# Patient Record
Sex: Male | Born: 1995 | Race: White | Hispanic: No | Marital: Single | Smoking: Never smoker
Health system: Southern US, Community
[De-identification: ages and names within clinical notes are randomized; demographics above are authoritative.]

## PROBLEM LIST (undated history)

## (undated) DIAGNOSIS — F419 Anxiety disorder, unspecified: Secondary | ICD-10-CM

## (undated) DIAGNOSIS — F429 Obsessive-compulsive disorder, unspecified: Secondary | ICD-10-CM

## (undated) DIAGNOSIS — K5010 Crohn's disease of large intestine without complications: Secondary | ICD-10-CM

## (undated) HISTORY — PX: DENTAL SURGERY: SHX609

## (undated) HISTORY — PX: COLONOSCOPY: SHX174

## (undated) HISTORY — PX: OTHER SURGICAL HISTORY: SHX169

---

## 2019-03-04 ENCOUNTER — Other Ambulatory Visit: Payer: Self-pay

## 2019-03-04 ENCOUNTER — Encounter (HOSPITAL_COMMUNITY): Payer: Self-pay

## 2019-03-04 ENCOUNTER — Emergency Department (HOSPITAL_COMMUNITY)
Admission: EM | Admit: 2019-03-04 | Discharge: 2019-03-04 | Disposition: A | Discharge status: Home / Self Care | Payer: 59 | Attending: Emergency Medicine | Admitting: Emergency Medicine

## 2019-03-04 DIAGNOSIS — R339 Retention of urine, unspecified: Secondary | ICD-10-CM | POA: Diagnosis present

## 2019-03-04 HISTORY — DX: Anxiety disorder, unspecified: F41.9

## 2019-03-04 HISTORY — DX: Obsessive-compulsive disorder, unspecified: F42.9

## 2019-03-04 HISTORY — DX: Crohn's disease of large intestine without complications: K50.10

## 2019-03-04 LAB — CBC WITH DIFFERENTIAL/PLATELET
Abs Immature Granulocytes: 0.01 10*3/uL (ref 0.00–0.07)
Basophils Absolute: 0 10*3/uL (ref 0.0–0.1)
Basophils Relative: 0 %
Eosinophils Absolute: 0 10*3/uL (ref 0.0–0.5)
Eosinophils Relative: 1 %
HCT: 43.6 % (ref 39.0–52.0)
Hemoglobin: 14.8 g/dL (ref 13.0–17.0)
Immature Granulocytes: 0 %
Lymphocytes Relative: 56 %
Lymphs Abs: 3 10*3/uL (ref 0.7–4.0)
MCH: 32.2 pg (ref 26.0–34.0)
MCHC: 33.9 g/dL (ref 30.0–36.0)
MCV: 94.8 fL (ref 80.0–100.0)
Monocytes Absolute: 0.7 10*3/uL (ref 0.1–1.0)
Monocytes Relative: 12 %
Neutro Abs: 1.7 10*3/uL (ref 1.7–7.7)
Neutrophils Relative %: 31 %
Platelets: 206 10*3/uL (ref 150–400)
RBC: 4.6 MIL/uL (ref 4.22–5.81)
RDW: 13.5 % (ref 11.5–15.5)
WBC: 5.4 10*3/uL (ref 4.0–10.5)
nRBC: 0 % (ref 0.0–0.2)

## 2019-03-04 LAB — URINALYSIS, ROUTINE W REFLEX MICROSCOPIC
Bilirubin Urine: NEGATIVE
Glucose, UA: NEGATIVE mg/dL
Hgb urine dipstick: NEGATIVE
Ketones, ur: NEGATIVE mg/dL
Leukocytes,Ua: NEGATIVE
Nitrite: NEGATIVE
Protein, ur: NEGATIVE mg/dL
Specific Gravity, Urine: 1.013 (ref 1.005–1.030)
pH: 6 (ref 5.0–8.0)

## 2019-03-04 LAB — BASIC METABOLIC PANEL
Anion gap: 9 (ref 5–15)
BUN: 23 mg/dL — ABNORMAL HIGH (ref 6–20)
CO2: 25 mmol/L (ref 22–32)
Calcium: 9.2 mg/dL (ref 8.9–10.3)
Chloride: 104 mmol/L (ref 98–111)
Creatinine, Ser: 0.68 mg/dL (ref 0.61–1.24)
GFR calc Af Amer: >60 mL/min (ref >60)
GFR calc non Af Amer: >60 mL/min (ref >60)
Glucose, Bld: 89 mg/dL (ref 70–99)
Potassium: 3.8 mmol/L (ref 3.5–5.1)
Sodium: 138 mmol/L (ref 135–145)

## 2019-03-04 NOTE — ED Provider Notes (Addendum)
[facility] DEPT Provider Note   CSN: [id number] Arrival date & time: 03/04/19  1224    History   Chief Complaint Chief Complaint  Patient presents with  . Urinary Retention    HPI Jon Lara is a 23 y.o. male with history of Crohn's colitis, OCD who presents with a 2-day history of difficulty urinating.  Patient has had very interrupted stream and urinating only 1 to 2 ounces at a time.  He has not fully emptied his bladder in 2 days.  He has some suprapubic discomfort, but denies any significant pain.  He denies any penile discharge, scrotal pain or swelling, fevers, chest pain, shortness of breath, cough.  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      HPI  Past Medical History:  Diagnosis Date  . Anxiety   . Crohn's colitis ([place])   . OCD (obsessive compulsive disorder)     There are no active problems to display for this patient.   Past Surgical History:  Procedure Laterality Date  . COLONOSCOPY    . DENTAL SURGERY    . urethra expanded    . varicele           Home Medications    Prior to Admission medications   Medication Sig Start Date End Date Taking? Authorizing Provider  acetaminophen (TYLENOL) 500 MG tablet Take 500 mg by mouth every 6 (six) hours as needed for headache.   Yes [provider]  finasteride (PROPECIA) 1 MG tablet Take 1 mg by mouth daily. 02/17/19  Yes [provider]  fluvoxaMINE (LUVOX) 25 MG tablet Take 25 mg by mouth daily.  02/19/19  Yes [provider]  mercaptopurine (PURINETHOL) 50 MG tablet Take 100 mg by mouth daily.  02/16/19  Yes [provider]    Family History Family History  Problem Relation Age of Onset  . Stroke Mother   . Cancer Father     Social History Social History   Tobacco Use  . Smoking status: Never Smoker  . Smokeless tobacco: Never Used  Substance Use Topics  .  Alcohol use: Yes  . Drug use: Never     Allergies   Patient has no known allergies.   Review of Systems Review of Systems  Constitutional: Negative for chills and fever.  HENT: Negative for facial swelling and sore throat.   Respiratory: Negative for shortness of breath.   Cardiovascular: Negative for chest pain.  Gastrointestinal: Negative for abdominal pain, nausea and vomiting.  Genitourinary: Positive for decreased urine volume and difficulty urinating. Negative for discharge, dysuria, flank pain, penile pain, penile swelling, scrotal swelling and testicular pain.  Musculoskeletal: Negative for back pain.  Skin: Negative for rash and wound.  Neurological: Negative for headaches.  Psychiatric/Behavioral: The patient is not nervous/anxious.      Physical Exam Updated Vital Signs BP (!) 115/56   Pulse 68   Temp 98.8 F (37.1 C) (Oral)   Resp 16   Ht 5' 8"  (1.727 m)   Wt 70.3 kg   SpO2 100%   BMI 23.57 kg/m   Physical Exam Vitals signs and nursing note reviewed. Exam conducted with a chaperone present.  Constitutional:      General: He is not in acute distress.    Appearance: He is well-developed. He is not diaphoretic.  HENT:     Head: Normocephalic and atraumatic.     Mouth/Throat:  Pharynx: No oropharyngeal exudate.  Eyes:     General: No scleral icterus.       Right eye: No discharge.        Left eye: No discharge.     Conjunctiva/sclera: Conjunctivae normal.     Pupils: Pupils are equal, round, and reactive to light.  Neck:     Musculoskeletal: Normal range of motion and neck supple.     Thyroid: No thyromegaly.  Cardiovascular:     Rate and Rhythm: Normal rate and regular rhythm.     Heart sounds: Normal heart sounds. No murmur. No friction rub. No gallop.   Pulmonary:     Effort: Pulmonary effort is normal. No respiratory distress.     Breath sounds: Normal breath sounds. No stridor. No wheezing or rales.  Abdominal:     General: Bowel sounds  are normal. There is no distension.     Palpations: Abdomen is soft.     Tenderness: There is abdominal tenderness (mild) in the suprapubic area. There is no guarding or rebound.     Comments: 500cc with bladder scan  Genitourinary:    Penis: Normal.      Scrotum/Testes: Normal. Cremasteric reflex is present.     Epididymis:     Right: Normal.     Left: Normal.  Lymphadenopathy:     Cervical: No cervical adenopathy.  Skin:    General: Skin is warm and dry.     Coloration: Skin is not pale.     Findings: No rash.  Neurological:     Mental Status: He is alert.     Coordination: Coordination normal.      ED Treatments / Results  Labs (all labs ordered are listed, but only abnormal results are displayed) Labs Reviewed  BASIC METABOLIC PANEL - Abnormal; Notable for the following components:      Result Value   BUN 23 (*)    All other components within normal limits  URINALYSIS, ROUTINE W REFLEX MICROSCOPIC  CBC WITH DIFFERENTIAL/PLATELET    EKG None  Radiology No results found.  Procedures Procedures (including critical care time)  Medications Ordered in ED Medications - No data to display   Initial Impression / Assessment and Plan / ED Course  I have reviewed the triage vital signs and the nursing notes.  Pertinent labs & imaging results that were available during my care of the patient were reviewed by me and considered in my medical decision making (see chart for details).        Patient presenting with a 2-day history of urinary retention.  Patient is having very little urine output.  UA is negative.  BUN mildly elevated at 23, however creatinine is stable.  Patient feels better after Foley catheter placed.  Will keep indwelling catheter and have patient follow-up to urology for further evaluation and removal of catheter.  Suspect recurrence of ureteral stricture, which patient had as an infant.  Return precautions and Foley care discussed.  Patient understands  and agrees with plan.  Patient vital stable throughout ED course and discharged in satisfactory condition.  I discussed patient case with Dr. Sedonia Small who guided the patient's management and agrees with plan.   Final Clinical Impressions(s) / ED Diagnoses   Final diagnoses:  Urinary retention    ED Discharge Orders    None           Frederica Kuster, PA-C 03/04/19 1522    Maudie Flakes, MD 03/05/19 [phone]

## 2019-03-04 NOTE — ED Triage Notes (Addendum)
Patient states they have not fully emptied their bladder fully  X 2 days.  Patienat normally lives in [place],but came back to [place] 4 weeks ago.

## 2019-03-04 NOTE — Discharge Instructions (Signed)
Please call the urologist tomorrow and let them know that you had a Foley catheter placed today.  They will schedule you an appointment accordingly.  Keep the Foley catheter in until you are seen in the office and it will be removed by them.  DO NOT try to pull it out yourself.  Please return to the emergency department if you develop any increasing pain, bleeding, urination around the catheter, or any other concerning symptoms.

## 2019-03-27 ENCOUNTER — Ambulatory Visit (HOSPITAL_COMMUNITY)
Admission: RE | Admit: 2019-03-27 | Discharge: 2019-03-27 | Disposition: A | Discharge status: Home / Self Care | Payer: 59 | Source: Ambulatory Visit | Attending: Urology | Admitting: Urology

## 2019-03-27 ENCOUNTER — Other Ambulatory Visit: Payer: Self-pay

## 2019-03-27 ENCOUNTER — Other Ambulatory Visit (HOSPITAL_COMMUNITY): Payer: Self-pay | Admitting: Urology

## 2019-03-27 ENCOUNTER — Ambulatory Visit (HOSPITAL_COMMUNITY): Admission: RE | Admit: 2019-03-27 | Payer: 59 | Source: Ambulatory Visit

## 2019-03-27 ENCOUNTER — Other Ambulatory Visit: Payer: Self-pay | Admitting: Urology

## 2019-03-27 DIAGNOSIS — M533 Sacrococcygeal disorders, not elsewhere classified: Secondary | ICD-10-CM

## 2019-04-02 ENCOUNTER — Other Ambulatory Visit: Payer: 59

## 2019-04-09 ENCOUNTER — Other Ambulatory Visit: Payer: Self-pay

## 2019-04-09 ENCOUNTER — Telehealth (INDEPENDENT_AMBULATORY_CARE_PROVIDER_SITE_OTHER): Payer: 59 | Admitting: Neurology

## 2019-04-09 ENCOUNTER — Encounter: Payer: Self-pay | Admitting: Neurology

## 2019-04-09 VITALS — Ht 69.0 in | Wt 155.0 lb

## 2019-04-09 DIAGNOSIS — K50918 Crohn's disease, unspecified, with other complication: Secondary | ICD-10-CM

## 2019-04-09 DIAGNOSIS — K51918 Ulcerative colitis, unspecified with other complication: Secondary | ICD-10-CM

## 2019-04-09 DIAGNOSIS — R202 Paresthesia of skin: Secondary | ICD-10-CM | POA: Diagnosis not present

## 2019-04-09 NOTE — Progress Notes (Signed)
New Patient Virtual Visit via Video Note The purpose of this virtual visit is to provide medical care while limiting exposure to the novel coronavirus.    Consent was obtained for video visit:  Yes.   Answered questions that patient had about telehealth interaction:  Yes.   I discussed the limitations, risks, security and privacy concerns of performing an evaluation and management service by telemedicine. I also discussed with the patient that there may be a patient responsible charge related to this service. The patient expressed understanding and agreed to proceed.  Pt location: Home Physician Location: office Name of referring provider:  Irine Seal, MD I connected with Willia Craze at patients initiation/request on 04/09/2019 at 11:00 AM EDT by video enabled telemedicine application and verified that I am speaking with the correct person using two identifiers. Pt MRN:  [id number] Pt DOB:  1996-08-04 Video Participants:  Willia Craze;  Mammie Russian (Dad)    History of Present Illness: Jon Lara is a 23 y.o. Caucasian male with Crohn's colitis and OCD presenting for evaluation of numbness/tingling in the feet.   In April 2020, he went to the ER with urinary retention requiring temporary catheter and he was started on flomax. Five-days following cessation of flomax on 4/23, he developed numbness/tingling over the soles of the feet. Later, he began having the same sensation over the coccyx.  Initially, it was worse with prolonged standing and sitting, but now constant.  Laying down makes it better.  No numbness/tingling in the fingers. Over the past two weeks, symptoms have becomes less intense and only involves the feet.  He had MRI lumbar spine on 5/5 which was normal.   He works for a [employer] in [place] and due to [place] is living with his parents here.  He is a very active individual and exercises with weights daily. Exercise has not exacerbated any of his  symptoms.  No new weakness, gait instability, or falls.   He had colonoscopy for Crohn's disease surveillance which was normal earlier this year. He has been in remission since 2016.  Urinary symptoms have resolved.   Out-side paper records, electronic medical record, and images have been reviewed where available and summarized as:  MRI lumbar spine 03/27/2019: 1. No significant lumbar spine disc protrusion, foraminal stenosis or central canal stenosis.   No results found for: HGBA1C No results found for: VITAMINB12 No results found for: TSH No results found for: ESRSEDRATE, POCTSEDRATE  Past Medical History:  Diagnosis Date  . Anxiety   . Crohn's colitis ([place])   . OCD (obsessive compulsive disorder)     Past Surgical History:  Procedure Laterality Date  . COLONOSCOPY    . DENTAL SURGERY    . urethra expanded    . varicele        Medications:  Outpatient Encounter Medications as of 04/09/2019  Medication Sig  . acetaminophen (TYLENOL) 500 MG tablet Take 500 mg by mouth every 6 (six) hours as needed for headache.  . finasteride (PROPECIA) 1 MG tablet Take 1 mg by mouth daily.  . fluvoxaMINE (LUVOX) 25 MG tablet Take 25 mg by mouth daily.   . mercaptopurine (PURINETHOL) 50 MG tablet Take 100 mg by mouth daily.    No facility-administered encounter medications on file as of 04/09/2019.     Allergies: No Known Allergies  Family History: Family History  Problem Relation Age of Onset  . Stroke Mother   . Cancer Father  Social History: Social History   Tobacco Use  . Smoking status: Never Smoker  . Smokeless tobacco: Never Used  Substance Use Topics  . Alcohol use: Yes  . Drug use: Never   Social History   Social History Narrative  . Not on file    Review of Systems:  CONSTITUTIONAL: No fevers, chills, night sweats, or weight loss.   EYES: No visual changes or eye pain ENT: No hearing changes.  No history of nose bleeds.   RESPIRATORY: No cough, wheezing  and shortness of breath.   CARDIOVASCULAR: Negative for chest pain, and palpitations.   GI: Negative for abdominal discomfort, blood in stools or black stools.  No recent change in bowel habits.   GU:  No history of incontinence.   MUSCLOSKELETAL: No history of joint pain or swelling.  No myalgias.   SKIN: Negative for lesions, rash, and itching.   HEMATOLOGY/ONCOLOGY: Negative for prolonged bleeding, bruising easily, and swollen nodes.  No history of cancer.   ENDOCRINE: Negative for cold or heat intolerance, polydipsia or goiter.   PSYCH:  No depression or anxiety symptoms.   NEURO: As Above.   Vital Signs:  Ht _0  (1.753 m)   Wt 155 lb (70.3 kg)   BMI 22.89 kg/m    General Medical Exam:  Well appearing, comfortable.  Nonlabored breathing.  No deformity or edema.  No rash.  Neurological Exam: MENTAL STATUS including orientation to time, place, person, recent and remote memory, attention span and concentration, language, and fund of knowledge is normal.  Speech is not dysarthric.  CRANIAL NERVES:  Normal conjugate, extra-ocular eye movements in all directions of gaze.  No ptosis.  Normal facial symmetry and movements.  Normal shoulder shrug and head rotation.  Tongue is midline.  MOTOR:  Antigravity in all extremities.  No abnormal movements.  No pronator drift.   SENSORY/REFLEXES:  Unable to assess   COORDINATION/GAIT: Normal finger to nose bilaterally.  Intact rapid alternating movements bilaterally.  Able to rise from a chair without using arms.  Gait narrow based and stable. Tandem and stressed gait intact.    IMPRESSION/PLAN: Bilateral feet paresthesias, nonspecific.  Not classic for peripheral neuropathy however he has inflammatory bowel disease, which can be associated with neuropathy and lead to vitamin deficiencies.  Recent labs showed normal CMP.  Doubt there was a medication effect causing this, nevertheless, it is very reassuring that symptoms are slowly improving.  -  Check ESR, TSH, vitamin B12, vitamin B1, folate, copper - Check NCS/EMG of bilateral legs - Encouraged him to stay well-hydrated with electrolyte water especially when exercising   Follow Up Instructions:  I discussed the assessment and treatment plan with the patient. The patient was provided an opportunity to ask questions and all were answered. The patient agreed with the plan and demonstrated an understanding of the instructions.   The patient was advised to call back or seek an in-person evaluation if the symptoms worsen or if the condition fails to improve as anticipated.  Return to clinic after testing   Alda Berthold, DO

## 2019-04-13 ENCOUNTER — Other Ambulatory Visit: Payer: Self-pay

## 2019-04-13 ENCOUNTER — Other Ambulatory Visit (INDEPENDENT_AMBULATORY_CARE_PROVIDER_SITE_OTHER): Payer: 59

## 2019-04-13 DIAGNOSIS — R202 Paresthesia of skin: Secondary | ICD-10-CM | POA: Diagnosis not present

## 2019-04-19 LAB — TSH: TSH: 1.93 mIU/L (ref 0.40–4.50)

## 2019-04-19 LAB — VITAMIN B12: Vitamin B-12: 763 pg/mL (ref 200–1100)

## 2019-04-19 LAB — VITAMIN B1: Vitamin B1 (Thiamine): 25 nmol/L (ref 8–30)

## 2019-04-19 LAB — COPPER, SERUM: Copper: 104 ug/dL (ref 70–175)

## 2019-04-19 LAB — FOLATE: Folate: 19.8 ng/mL

## 2019-04-27 ENCOUNTER — Telehealth: Payer: Self-pay

## 2019-04-27 NOTE — Telephone Encounter (Signed)
Left message for patient to call office regarding normal lab results.

## 2019-04-27 NOTE — Telephone Encounter (Signed)
-----   Message from Alda Berthold, DO sent at 04/27/2019 11:31 AM EDT ----- Please notify patient lab are within normal limits.  Thank you.

## 2019-04-30 ENCOUNTER — Telehealth: Payer: Self-pay

## 2019-04-30 NOTE — Telephone Encounter (Signed)
Left message for patient to call office to inform him of normal results.

## 2019-09-23 IMAGING — MR MRI LUMBAR SPINE WITHOUT CONTRAST
4 of 5 series · 19 of 48 positions shown · non-contrast
Comparison: None.

CLINICAL DATA: Difficulty emptying the bladder

EXAM:
MRI LUMBAR SPINE WITHOUT CONTRAST
TECHNIQUE: Multiplanar, multisequence MR imaging of the lumbar spine was
performed. No intravenous contrast was administered.

[Series 3: T1 · sagittal · 4.0mm · 0.51mm/px · 3 of 12 slices shown (1 of 2)]
[im 3/12]
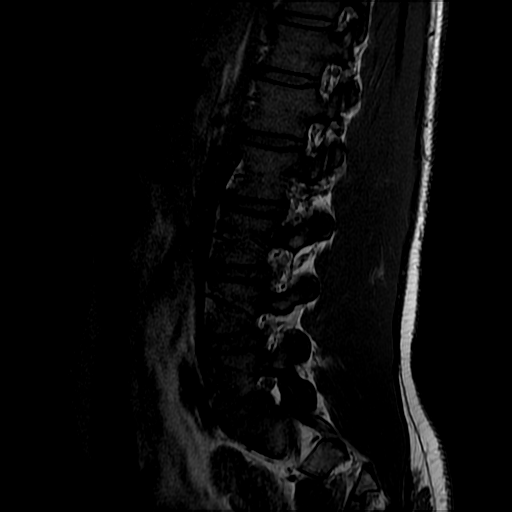
[im 7/12]
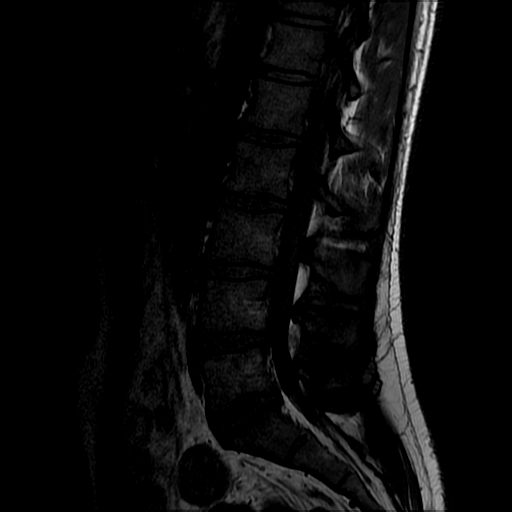
[im 12/12]
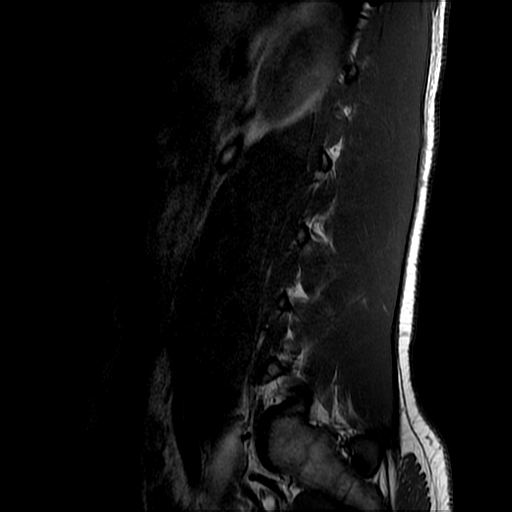

[Series 4: T2 post-contrast · sagittal · 4.0mm · 0.51mm/px · 5 of 12 slices shown]
[im 1/12]
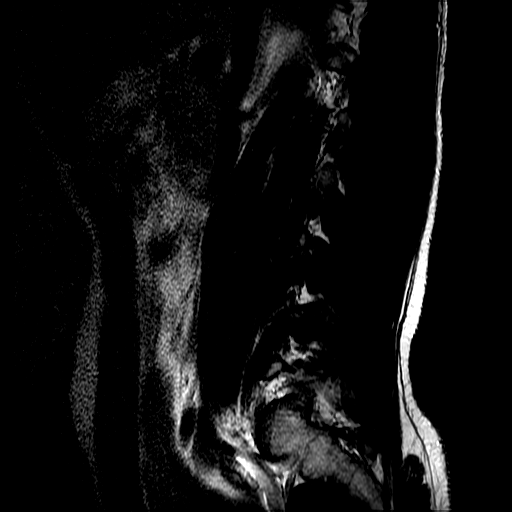
[im 3/12]
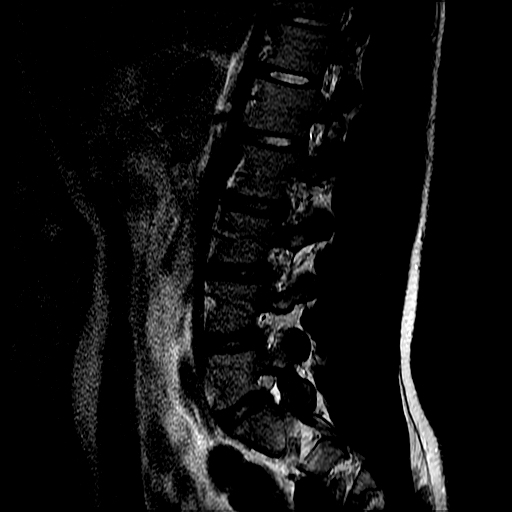
[im 6/12]
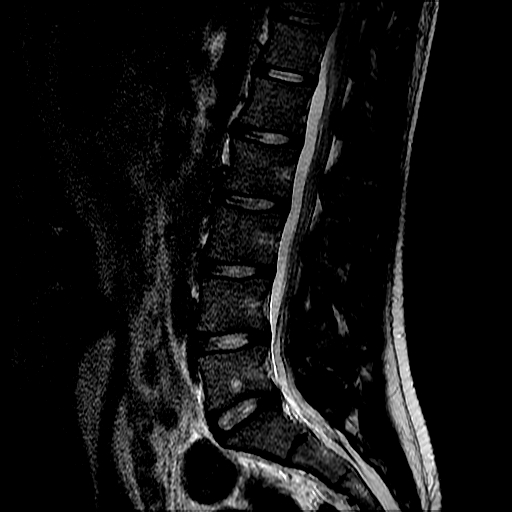
[im 9/12]
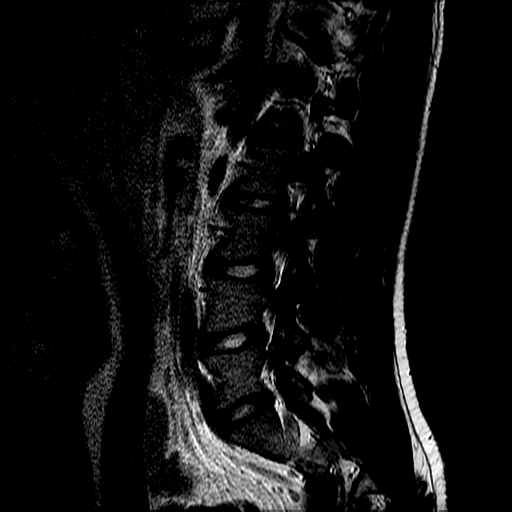
[im 12/12]
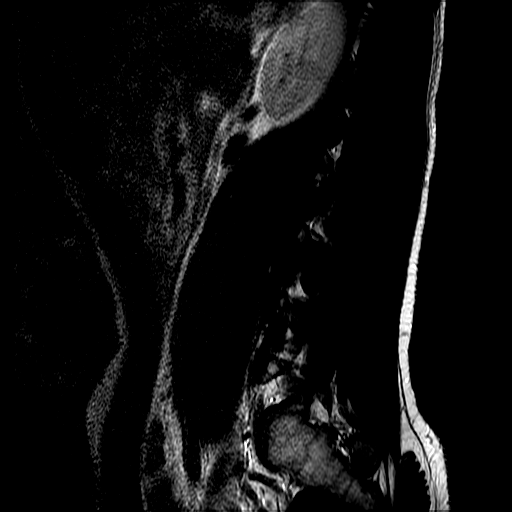

[Series 6: T2 · axial · 4.0mm · 0.39mm/px · z∈[-83,+98]mm · 8 of 39 slices shown]
[im 3/39]
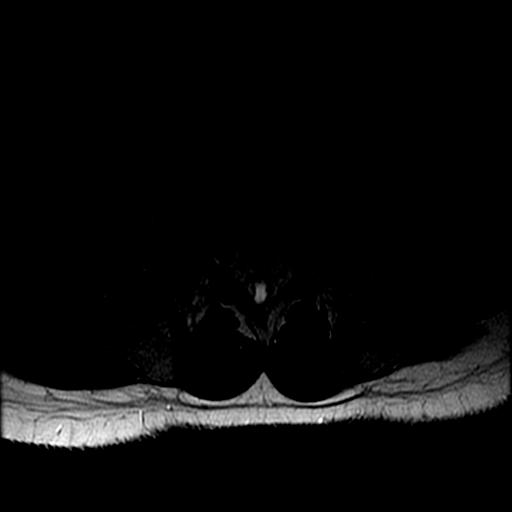
[im 6/39]
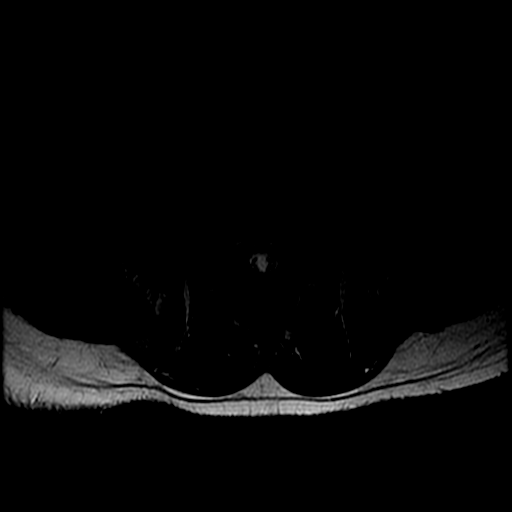
[im 8/39]
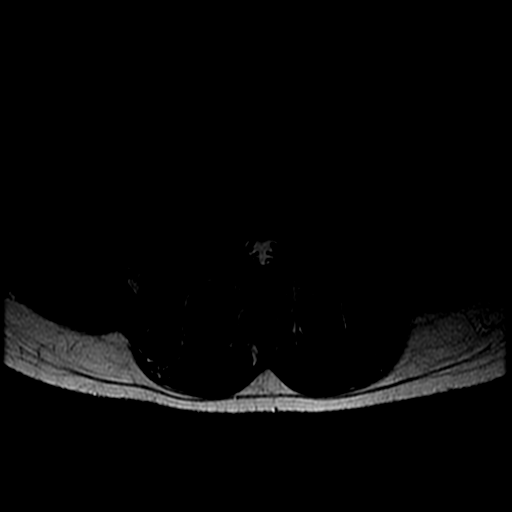
[im 13/39]
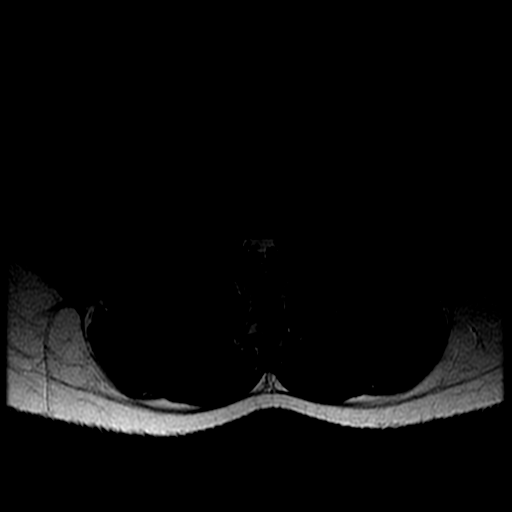
[im 18/39]
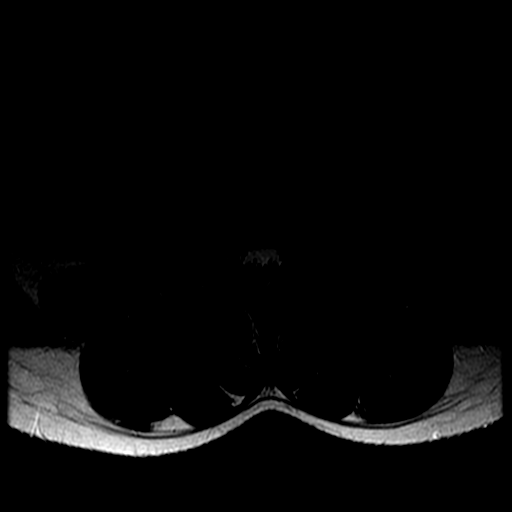
[im 21/39]
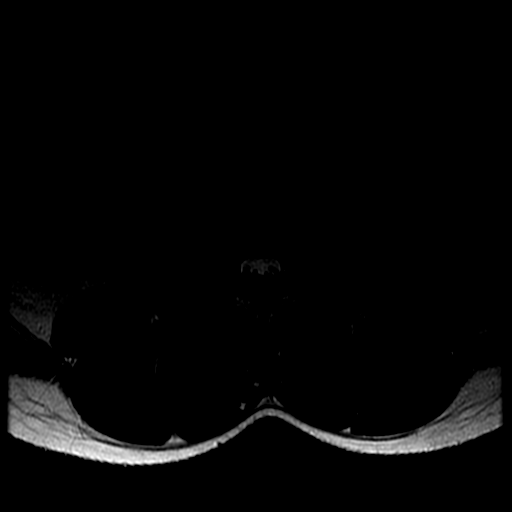
[im 23/39]
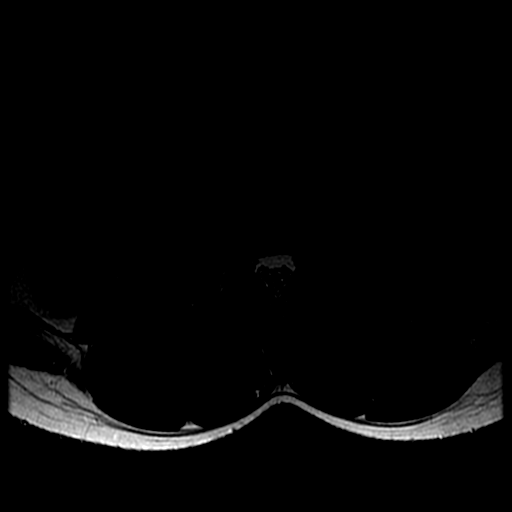
[im 33/39]
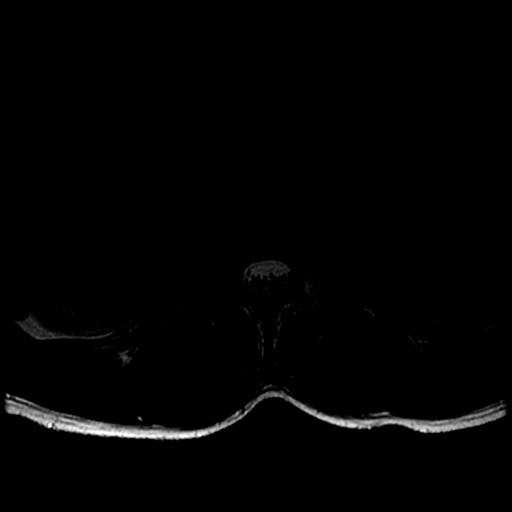

[Series 7: T1 · axial · 4.0mm · 0.39mm/px · z∈[-68,+98]mm · 3 of 39 slices shown (2 of 2)]
[im 6/39]
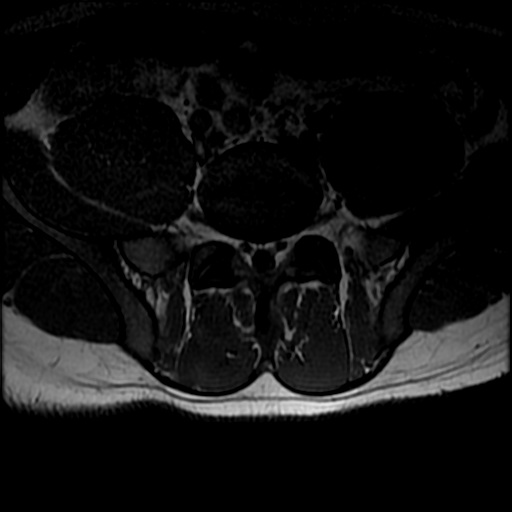
[im 21/39]
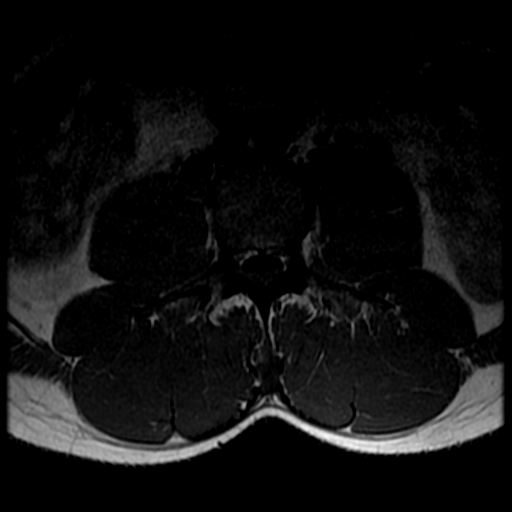
[im 33/39]
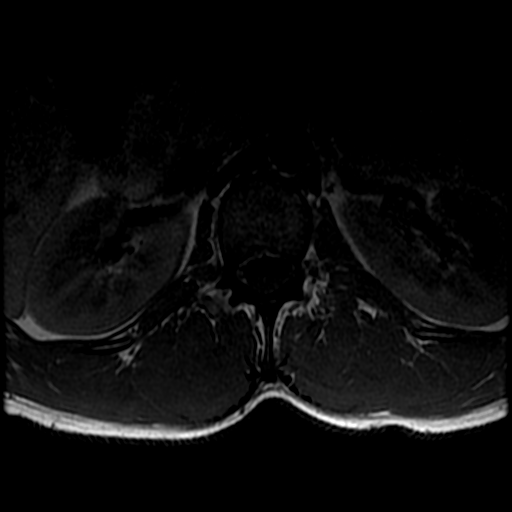

[19 of 48 positions shown; findings below may reference images not displayed]

FINDINGS: Segmentation:  Standard.

Alignment:  Physiologic.

Vertebrae:  No fracture, evidence of discitis, or bone lesion.

Conus medullaris and cauda equina: Conus extends to the T12 level.
Conus and cauda equina appear normal.

Paraspinal and other soft tissues: No acute paraspinal abnormality.

Disc levels:

Disc spaces: The disc spaces are maintained.

T12-L1: No significant disc bulge. No evidence of neural foraminal
stenosis. No central canal stenosis.

L1-L2: No significant disc bulge. No evidence of neural foraminal
stenosis. No central canal stenosis.

L2-L3: No significant disc bulge. No evidence of neural foraminal
stenosis. No central canal stenosis.

L3-L4: No significant disc bulge. No evidence of neural foraminal
stenosis. No central canal stenosis.

L4-L5: No significant disc bulge. No evidence of neural foraminal
stenosis. No central canal stenosis.

L5-S1: No significant disc bulge. No evidence of neural foraminal
stenosis. No central canal stenosis.
IMPRESSION: 1. No significant lumbar spine disc protrusion, foraminal stenosis
or central canal stenosis.
